# Patient Record
Sex: Female | Born: 1995 | Race: White | Hispanic: No | Marital: Single | Smoking: Never smoker
Health system: Southern US, Community
[De-identification: ages and names within clinical notes are randomized; demographics above are authoritative.]

## PROBLEM LIST (undated history)

## (undated) HISTORY — PX: ANTERIOR CRUCIATE LIGAMENT REPAIR: SHX115

---
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
## 2014-06-21 ENCOUNTER — Ambulatory Visit
Admission: RE | Admit: 2014-06-21 | Discharge: 2014-06-21 | Disposition: A | Discharge status: Home / Self Care | Payer: BLUE CROSS/BLUE SHIELD | Attending: Family Medicine | Admitting: Family Medicine

## 2014-06-21 ENCOUNTER — Ambulatory Visit
Admission: RE | Admit: 2014-06-21 | Discharge: 2014-06-21 | Disposition: A | Discharge status: Home / Self Care | Payer: BLUE CROSS/BLUE SHIELD | Source: Ambulatory Visit | Attending: Family Medicine | Admitting: Family Medicine

## 2014-06-21 ENCOUNTER — Other Ambulatory Visit: Payer: Self-pay | Admitting: Family Medicine

## 2014-06-21 DIAGNOSIS — M25561 Pain in right knee: Secondary | ICD-10-CM

## 2014-12-08 ENCOUNTER — Ambulatory Visit
Admission: RE | Admit: 2014-12-08 | Discharge: 2014-12-08 | Disposition: A | Discharge status: Home / Self Care | Payer: BLUE CROSS/BLUE SHIELD | Source: Ambulatory Visit | Attending: Family Medicine | Admitting: Family Medicine

## 2014-12-08 ENCOUNTER — Other Ambulatory Visit: Payer: Self-pay | Admitting: Family Medicine

## 2014-12-08 DIAGNOSIS — R609 Edema, unspecified: Secondary | ICD-10-CM

## 2014-12-08 DIAGNOSIS — M25562 Pain in left knee: Secondary | ICD-10-CM | POA: Insufficient documentation

## 2014-12-08 DIAGNOSIS — R52 Pain, unspecified: Secondary | ICD-10-CM

## 2014-12-09 ENCOUNTER — Ambulatory Visit
Admission: RE | Admit: 2014-12-09 | Discharge: 2014-12-09 | Disposition: A | Discharge status: Home / Self Care | Payer: BLUE CROSS/BLUE SHIELD | Source: Ambulatory Visit | Attending: Family Medicine | Admitting: Family Medicine

## 2014-12-09 ENCOUNTER — Other Ambulatory Visit: Payer: Self-pay | Admitting: Family Medicine

## 2014-12-09 DIAGNOSIS — Y9379 Activity, other specified sports and athletics: Secondary | ICD-10-CM | POA: Insufficient documentation

## 2014-12-09 DIAGNOSIS — M25562 Pain in left knee: Secondary | ICD-10-CM

## 2014-12-09 DIAGNOSIS — M25462 Effusion, left knee: Secondary | ICD-10-CM | POA: Diagnosis present

## 2014-12-09 DIAGNOSIS — S83512A Sprain of anterior cruciate ligament of left knee, initial encounter: Secondary | ICD-10-CM | POA: Insufficient documentation

## 2014-12-09 DIAGNOSIS — S8992XA Unspecified injury of left lower leg, initial encounter: Secondary | ICD-10-CM | POA: Diagnosis present

## 2014-12-09 DIAGNOSIS — S83222A Peripheral tear of medial meniscus, current injury, left knee, initial encounter: Secondary | ICD-10-CM | POA: Diagnosis not present

## 2015-04-28 ENCOUNTER — Encounter: Payer: Self-pay | Admitting: Emergency Medicine

## 2015-04-28 ENCOUNTER — Emergency Department
Admission: EM | Admit: 2015-04-28 | Discharge: 2015-04-28 | Disposition: A | Discharge status: Home / Self Care | Payer: BLUE CROSS/BLUE SHIELD | Attending: Emergency Medicine | Admitting: Emergency Medicine

## 2015-04-28 ENCOUNTER — Emergency Department: Payer: BLUE CROSS/BLUE SHIELD

## 2015-04-28 DIAGNOSIS — Z9889 Other specified postprocedural states: Secondary | ICD-10-CM | POA: Diagnosis not present

## 2015-04-28 DIAGNOSIS — G8918 Other acute postprocedural pain: Secondary | ICD-10-CM

## 2015-04-28 DIAGNOSIS — R2242 Localized swelling, mass and lump, left lower limb: Secondary | ICD-10-CM | POA: Diagnosis present

## 2015-04-28 NOTE — ED Provider Notes (Signed)
[facility] Emergency Department Provider Note   ____________________________________________  Time seen: Approximately 7:20 PM I have reviewed the triage vital signs and the triage nursing note.  HISTORY  Chief Complaint Leg Swelling   Historian Patient  HPI Gabriella Morgan is a 20 y.o. female with a history of anterior cruciate ligament repair 2 weeks ago on the left, she was in physical therapy today when she was noted to have some significant tenderness in the left calf. There is postoperative swelling which she thinks is actually going down. Pain is worse with movement or palpation. No fever. No skin rash or redness.Pain is moderate.    History reviewed. No pertinent past medical history.  There are no active problems to display for this patient.   Past Surgical History  Procedure Laterality Date  . Anterior cruciate ligament repair Bilateral     No current outpatient prescriptions on file.  Allergies Review of patient's allergies indicates no known allergies.  No family history on file.  Social History Social History  Substance Use Topics  . Smoking status: Never Smoker   . Smokeless tobacco: None  . Alcohol Use: Yes    Review of Systems  Constitutional: Negative for fever. Eyes:  ENT:  Cardiovascular: Negative for chest pain. Respiratory: Negative for shortness of breath. Gastrointestinal:  Genitourinary: Musculoskeletal:  Skin: Negative for rash. Neurological: 10 point Review of Systems otherwise negative ____________________________________________   PHYSICAL EXAM:  VITAL SIGNS: ED Triage Vitals  Enc Vitals Group     BP 04/28/15 1646 130/86 mmHg     Pulse Rate 04/28/15 1646 61     Resp 04/28/15 1646 18     Temp 04/28/15 1646 98.1 F (36.7 C)     Temp Source 04/28/15 1646 Oral     SpO2 04/28/15 1646 97 %     Weight 04/28/15 1646 135 lb (61.236 kg)     Height 04/28/15 1646  (1.6 m)     Head Cir --      Peak  Flow --      Pain Score 04/28/15 1647 2     Pain Loc --      Pain Edu? --      Excl. in GC? --      Constitutional: Alert and oriented. Well appearing and in no distress. HEENT   Head: Normocephalic and atraumatic.      Eyes: Conjunctivae are normal. PERRL. Normal extraocular movements.      Ears:         Nose: No congestion/rhinnorhea.   Mouth/Throat: Mucous membranes are moist.   Neck: No stridor. Cardiovascular/Chest: Normal rate, regular rhythm.  No murmurs, rubs, or gallops. Respiratory: Normal respiratory effort without tachypnea nor retractions. Breath sounds are clear and equal bilaterally. No wheezes/rales/rhonchi. Gastrointestinal:  Genitourinary/rectal:Deferred Musculoskeletal: Left knee Steri-Strips in place. Moderate swelling. Some tenderness to palpation and range of motion, mild. Mild Tenderness. Neurologic:  Normal speech and language. No gross or focal neurologic deficits are appreciated. Skin:  Skin is warm, dry and intact. No rash noted. Psychiatric: Mood and affect are normal. Speech and behavior are normal. Patient exhibits appropriate insight and judgment.  ____________________________________________   EKG I, Governor Rooks, MD, the attending physician have personally viewed and interpreted all ECGs.  None ____________________________________________  LABS (pertinent positives/negatives)  None  ____________________________________________  RADIOLOGY All Xrays were viewed by me. Imaging interpreted by Radiologist.  Ultrasound lower extremity left: No evidence of left lower extremity DVT __________________________________________  PROCEDURES  Procedure(s) performed:  None  Critical Care performed: None  ____________________________________________   ED COURSE / ASSESSMENT AND PLAN  Pertinent labs & imaging results that were available during my care of the patient were reviewed by me and considered in my medical decision making (see  chart for details).    No DVT. On exam no evidence of cellulitis. I suspect her swelling and pain is still postoperative especially with physical therapy.    CONSULTATIONS:   None   Patient / Family / Caregiver informed of clinical course, medical decision-making process, and agree with plan.   I discussed return precautions, follow-up instructions, and discharged instructions with patient and/or family.   ___________________________________________   FINAL CLINICAL IMPRESSION(S) / ED DIAGNOSES   Final diagnoses:  Post-operative pain              Note: This dictation was prepared with Dragon dictation. Any transcriptional errors that result from this process are unintentional   Governor Rooksebecca Demaryius Imran, MD 04/28/15 1944

## 2015-04-28 NOTE — ED Notes (Signed)
Patient states that she had surgery on her ACL 2 weeks ago. Patient noticed today that she was having some pain in her left calf and was concerned about a DVT.

## 2015-04-28 NOTE — ED Notes (Signed)
Patient to ER for c/o left leg swelling. Patient had ACL surgery 16 days ago. States swelling went down after surgery, but has now become more swollen again. +Pain to left calf.

## 2015-04-28 NOTE — Discharge Instructions (Signed)
You were evaluated for left calf pain and swelling, and no blood clot (no DVT) was found. I suspect your pain and swelling her postoperative in nature.  Return to the emergency department for any new or worsening condition including fever, skin rash, new or worsening pain, numbness, or weakness.

## 2015-12-26 IMAGING — CR DG KNEE COMPLETE 4+V*L*
1 series · 4 of 4 positions shown · non-contrast
Comparison: None.

CLINICAL DATA: Posteromedial knee pain. Lacrosse injury yesterday.
Initial encounter.

EXAM:
LEFT KNEE - COMPLETE 4+ VIEW

[Series 1: dg knee complete 4 views left · 0.14mm/px · 4 of 4 slices shown]
[im 1/4]
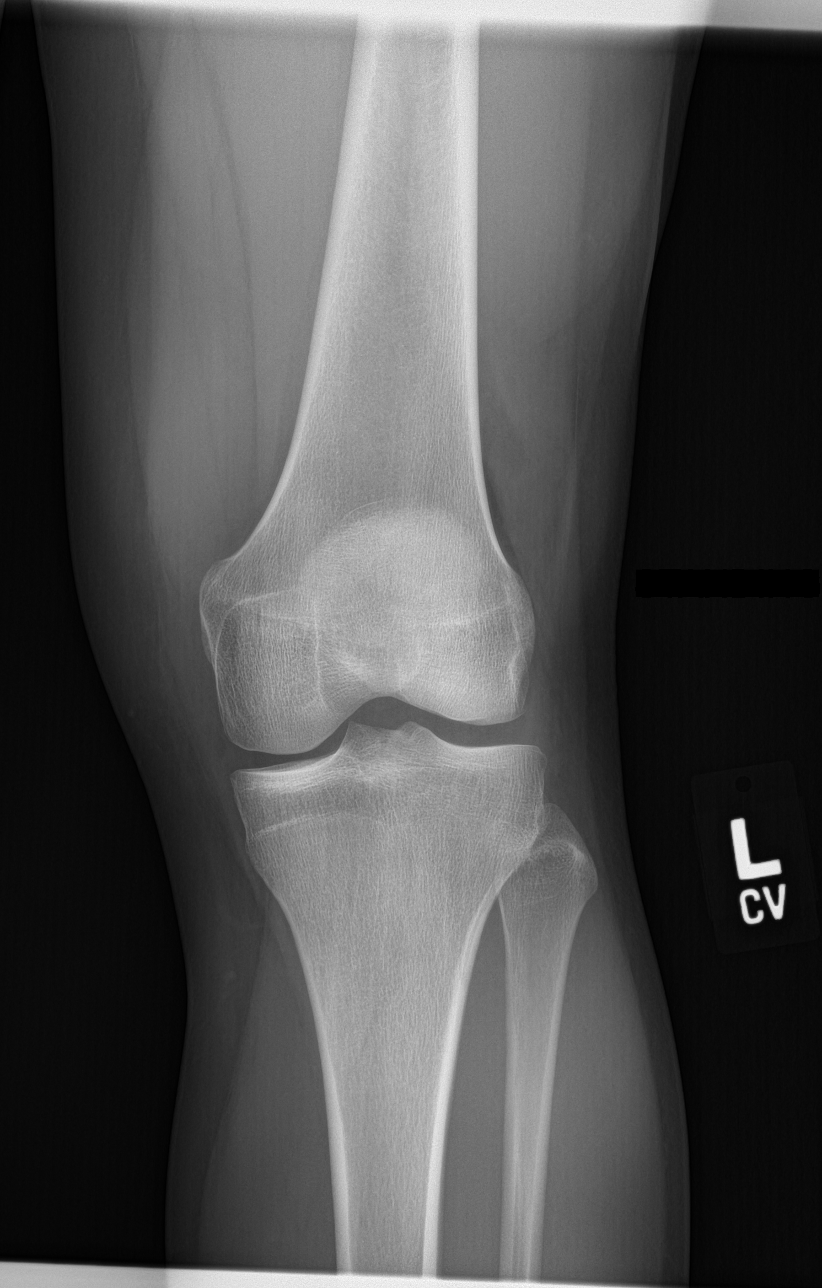
[im 2/4]
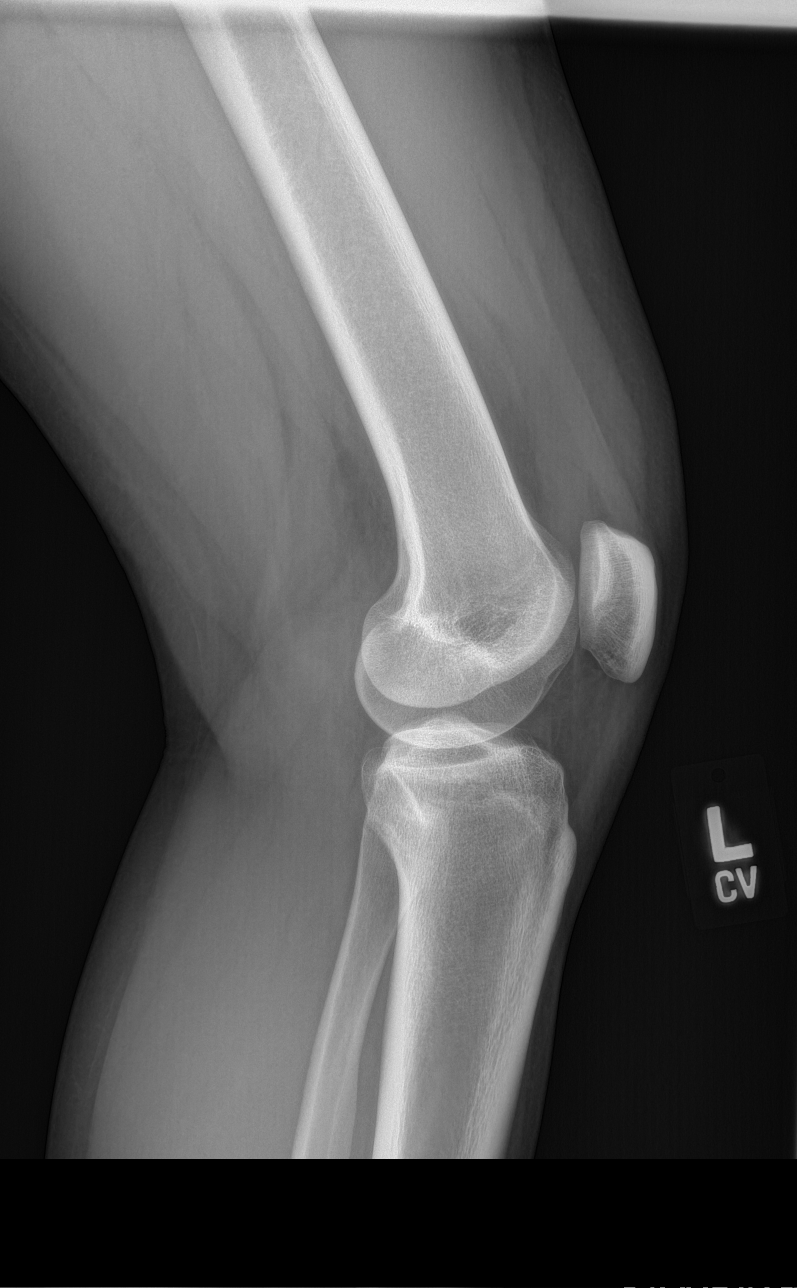
[im 3/4]
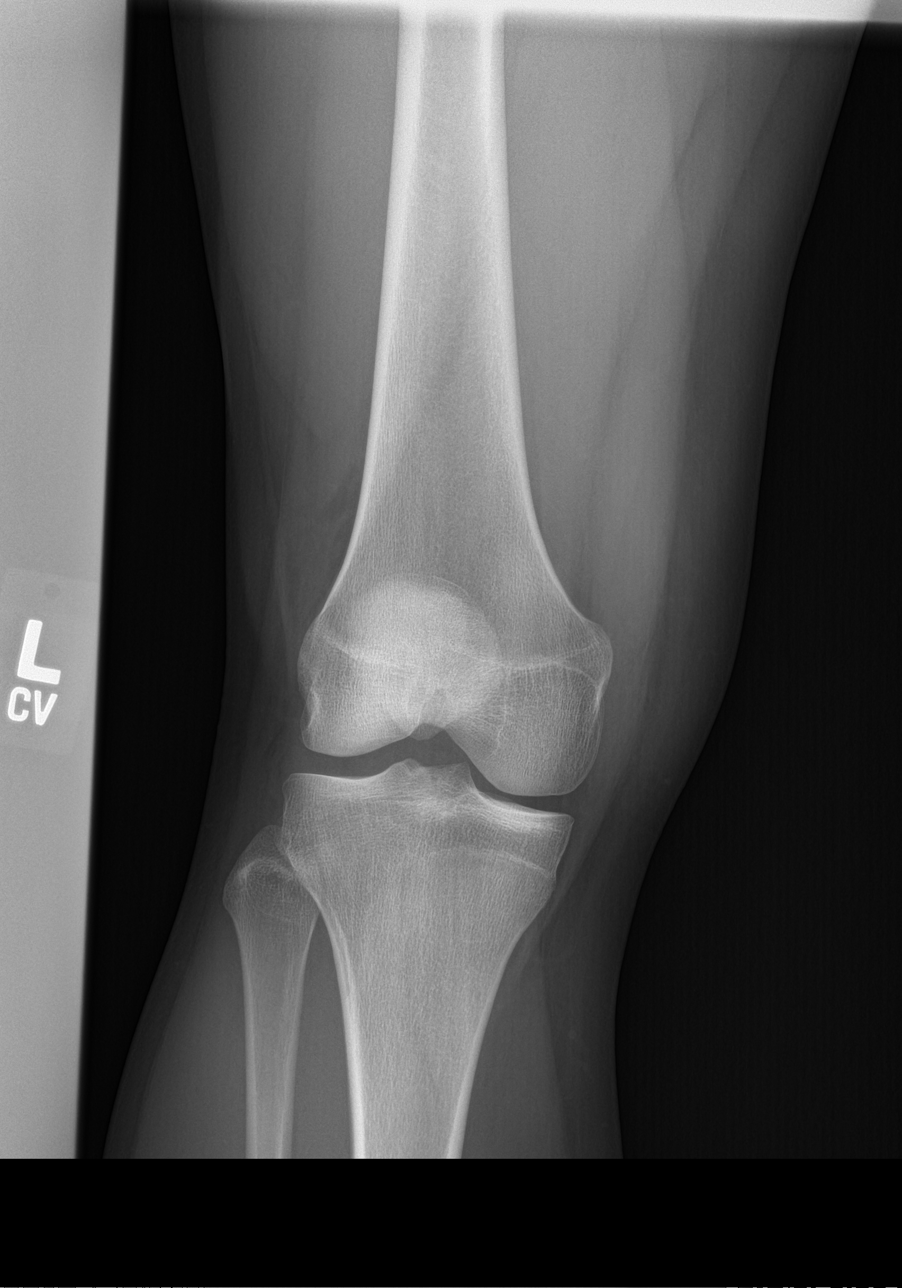
[im 4/4]
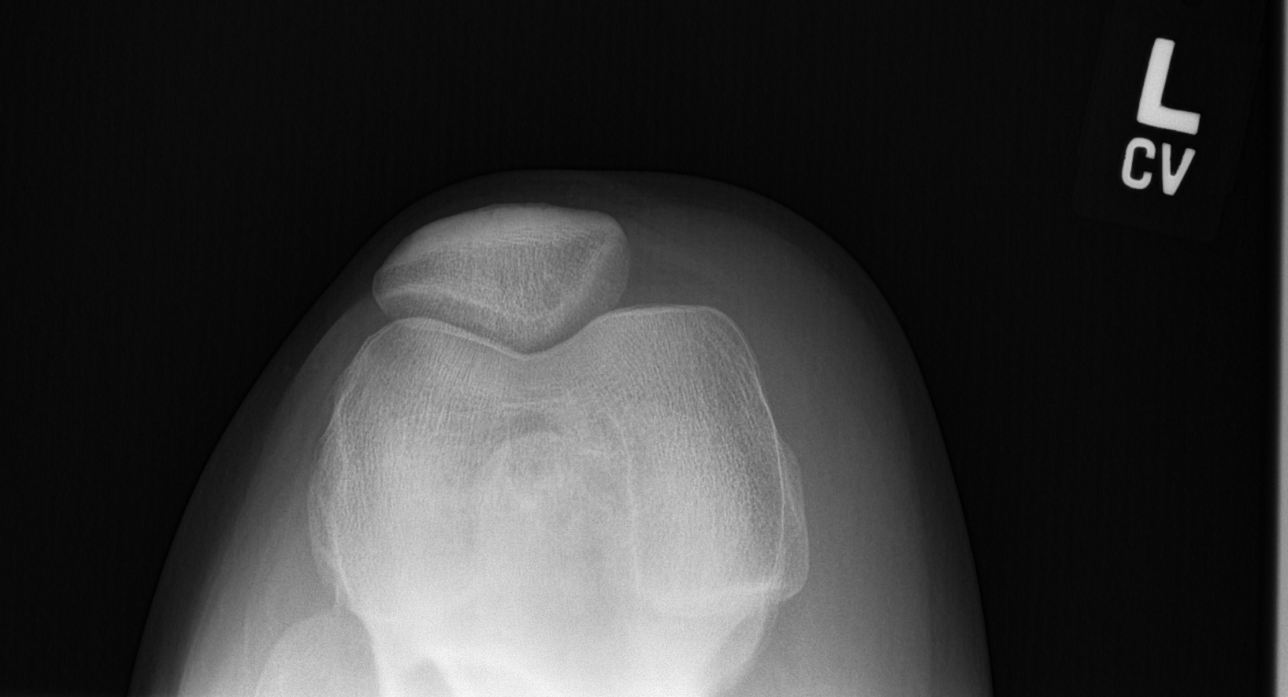

[4 of 4 positions shown; findings below may reference images not displayed]

FINDINGS: The mineralization and alignment are normal. There is no evidence of
acute fracture or dislocation. The joint spaces are maintained.
There is a possible small joint effusion.
IMPRESSION: No acute osseous findings.  Possible small joint effusion.

## 2017-05-21 IMAGING — US US EXTREM LOW VENOUS*L*
1 series · 13 of 24 positions shown · non-contrast
Comparison: None.

CLINICAL DATA: Left calf pain and swelling. Knee surgery 2 weeks
prior.



[Series 1: us extrem low venous*left* · 0.06mm/px · 13 of 47 slices shown]
[im 1/47]
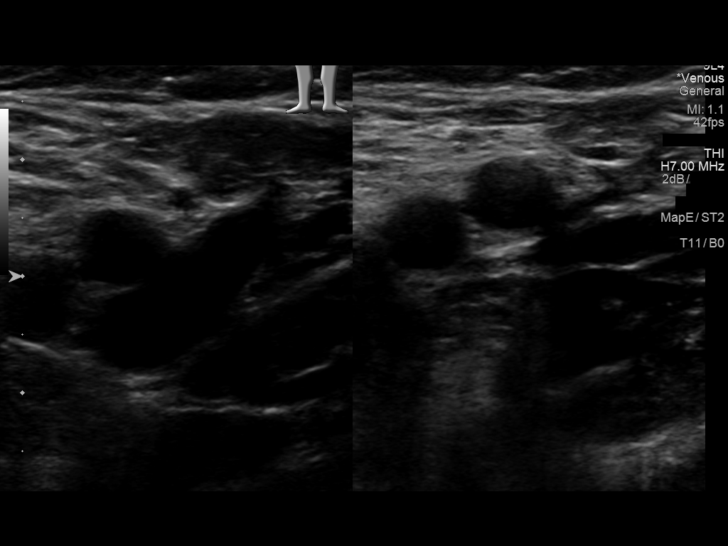
[im 5/47]
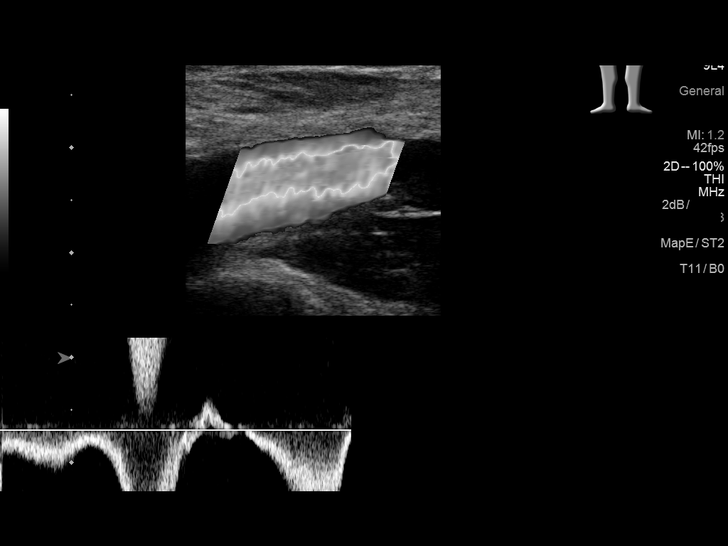
[im 9/47]
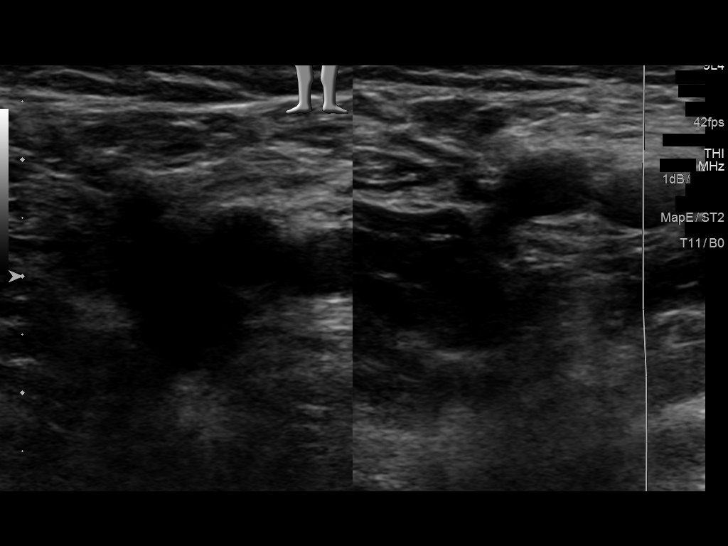
[im 13/47]
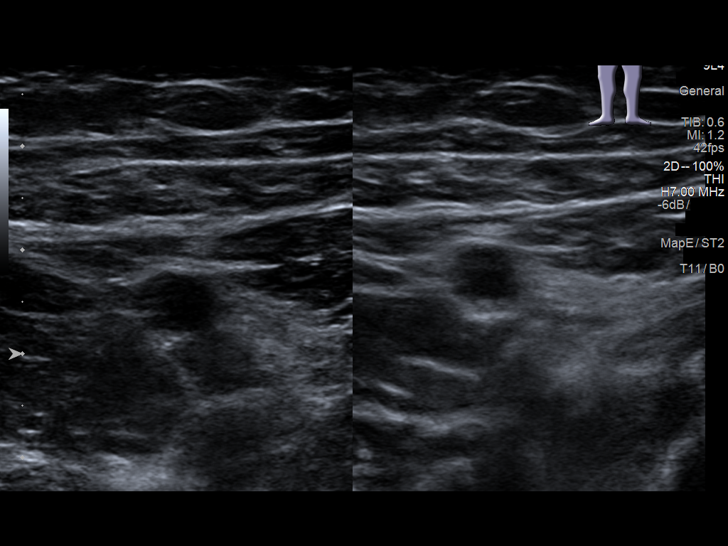
[im 17/47]
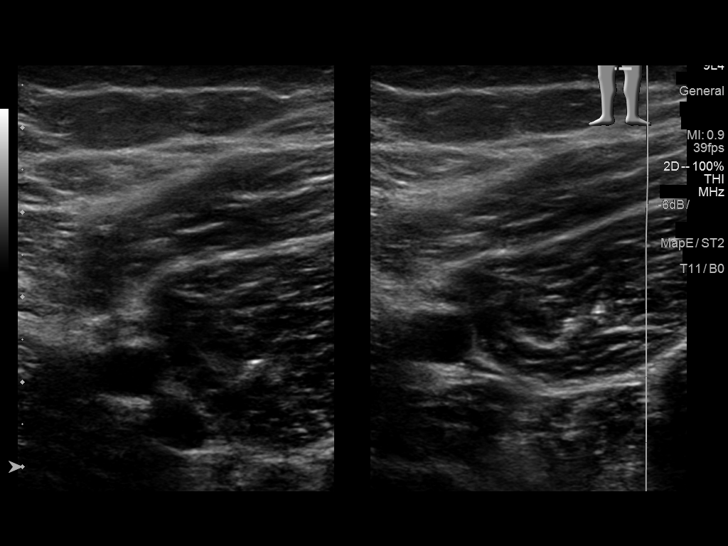
[im 21/47]
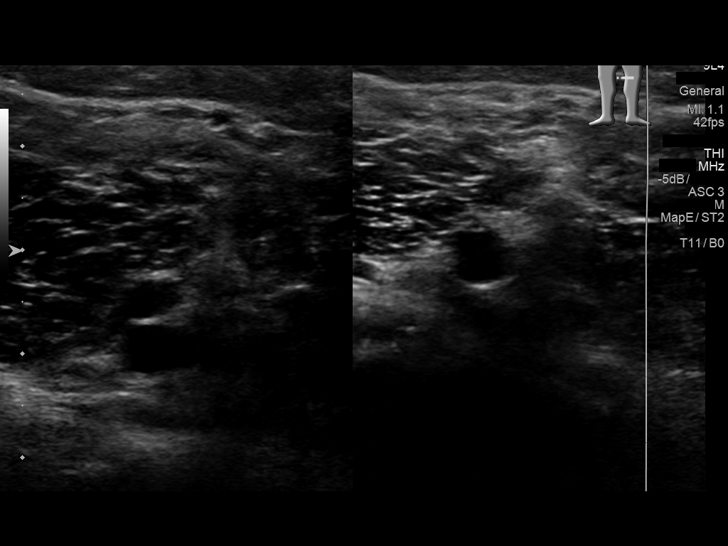
[im 25/47]
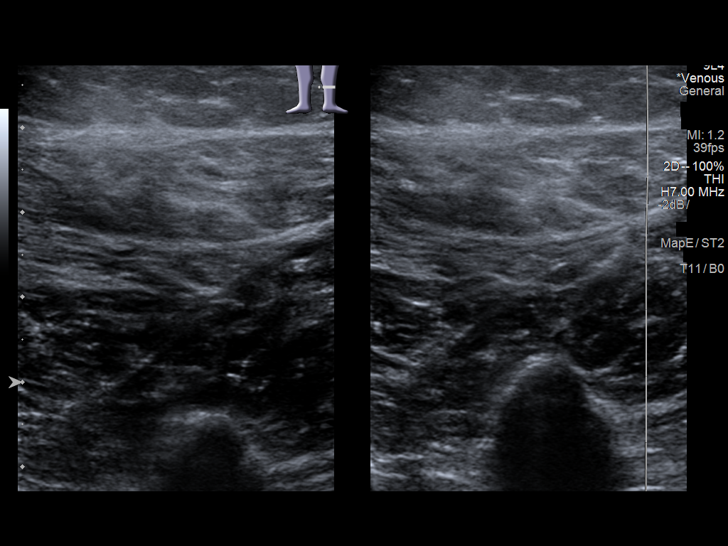
[im 27/47]
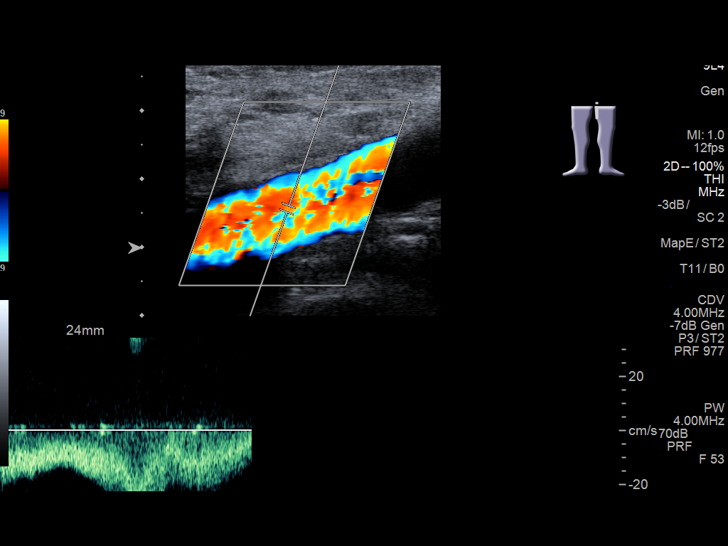
[im 31/47]
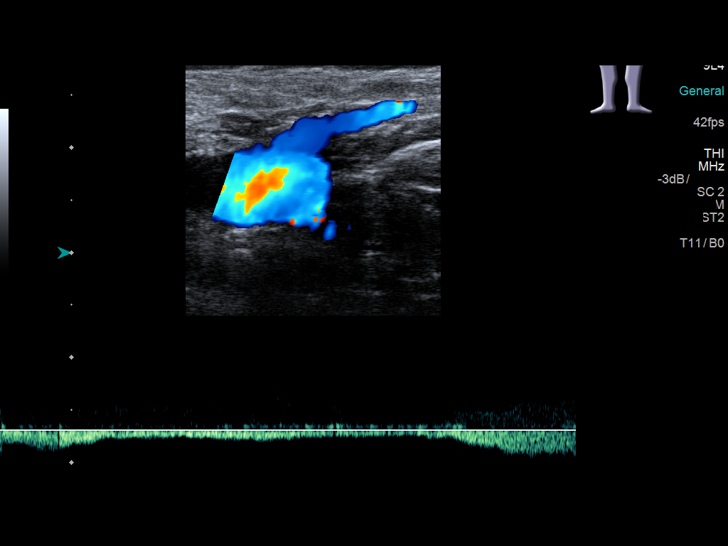
[im 35/47]
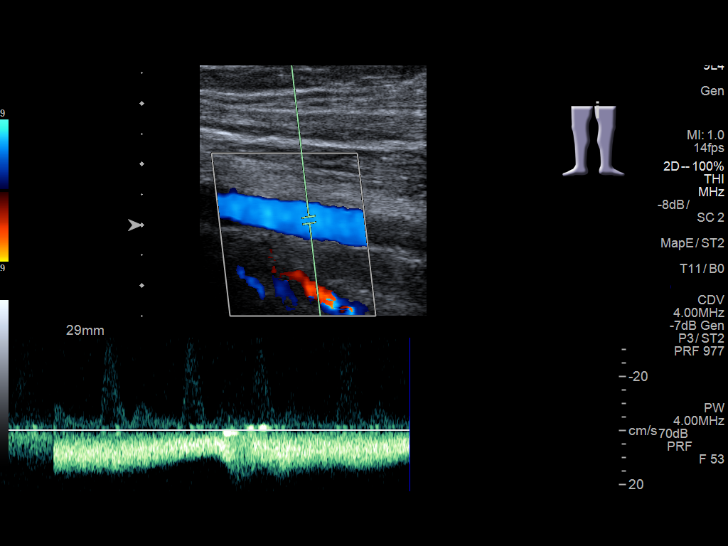
[im 39/47]
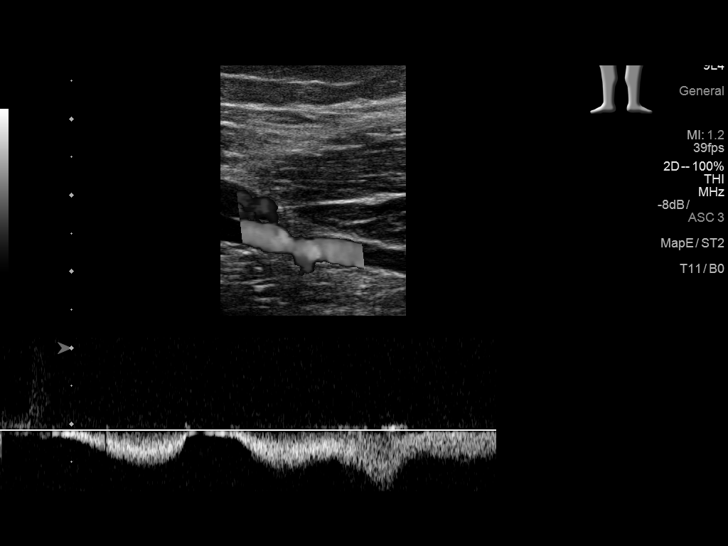
[im 43/47]
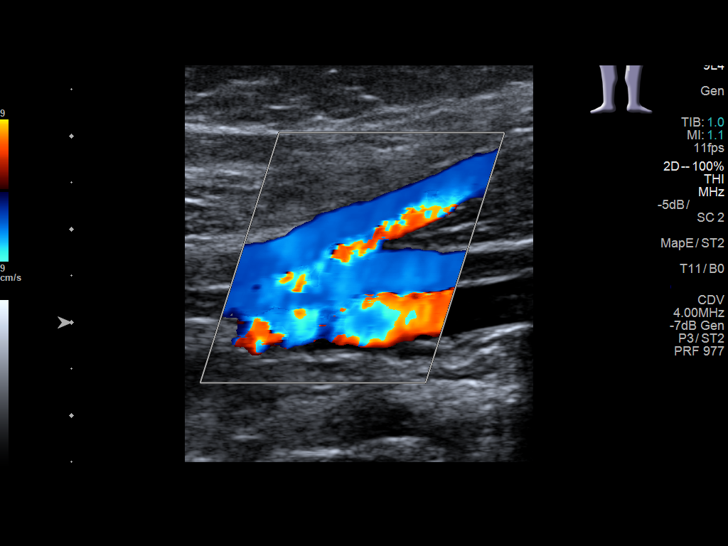
[im 47/47]
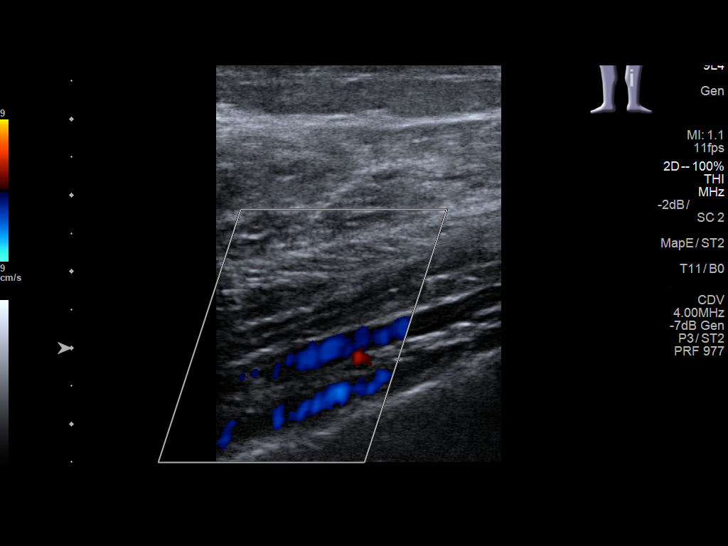

[13 of 24 positions shown; findings below may reference images not displayed]

FINDINGS: Contralateral Common Femoral Vein: Respiratory phasicity is normal
and symmetric with the symptomatic side. No evidence of thrombus.
Normal compressibility.

Common Femoral Vein: No evidence of thrombus. Normal
compressibility, respiratory phasicity and response to augmentation.

Saphenofemoral Junction: No evidence of thrombus. Normal
compressibility and flow on color Doppler imaging.

Profunda Femoral Vein: No evidence of thrombus. Normal
compressibility and flow on color Doppler imaging.

Femoral Vein: No evidence of thrombus. Normal compressibility,
respiratory phasicity and response to augmentation.

Popliteal Vein: No evidence of thrombus. Normal compressibility,
respiratory phasicity and response to augmentation.

Calf Veins: No evidence of thrombus. Normal compressibility and flow
on color Doppler imaging.

Superficial Great Saphenous Vein: No evidence of thrombus. Normal
compressibility and flow on color Doppler imaging.

Venous Reflux:  None.

Other Findings:  None.
IMPRESSION: No evidence of left lower extremity deep venous thrombosis.

## 2018-04-14 DIAGNOSIS — L700 Acne vulgaris: Secondary | ICD-10-CM

## 2018-04-14 MED ORDER — SPIRONOLACTONE 50 MG TABLET
50 | ORAL_TABLET | Freq: Every day | ORAL | 2 refills | Status: AC
Start: 2018-04-14 — End: ?

## 2018-04-14 MED ORDER — NORETHINDRONE-ETH. ESTRADIOL-IRON 1-20 (5)/1-30(7)/1MG-35MCG(9) TABLET
ORAL | Status: AC
Start: 2018-04-14 — End: ?

## 2018-04-14 NOTE — Progress Notes (Signed)
Subjective   Chief Complaint   Patient presents with    Acne     NP - Face     History of Present Illness:  Shelley Peck is a 23 y.o. female with HPI as follows:  Here for evaluation of acne on face x 3 months. She had acne as a teenager, but this is the first flare she's experienced since. No cyclic pattern. No chest/back involvement. Not currently using prescription tx for her acne. No exacerbating factors.    Personal Skin Cancer History   Melanoma No   Nonmelanoma skin cancer No       Family Skin Cancer Hx     Problem Relation (Age of Onset)    Basal cell carcinoma Neg Hx    Melanoma Neg Hx    Squamous cell carcinoma Neg Hx        Patient's allergies, medications, past medical, surgical, family and social histories were reviewed and updated as appropriate. and Shelley Peck  has no past medical history of Melanoma (HCC) or Nonmelanoma skin cancer.    Review of Systems   Constitutional: Negative.    Musculoskeletal: Negative.    Skin/Breast: Negative for changing mole.          Objective   Physical Examination:  Skin elements examined without findings:    Neck    Chest & Axillae    Abdomen    Back    R Upper Extremity    L Upper Extremity    R Lower Extremity    L Lower Extremity    Genitalia, Groin, & Buttocks    Hair    Eyes (Conjunctiva and Lids)  Skin elements examined with findings:    Head.  Skin exam findings:    Face - sparse acneiform papules    Additional Exam Findings    General Appearance negative: Well-appearing, well-nourished, with no obvious deformities.     Orientation negative: Oriented to time, place and person.     Mood & Affect negative: No evidence of depression, mania, anxiety, or agitation.       Data Reviewed:  Patient's relevant lab, radiology, micro, and pathology results were reviewed as appropriate.       Assessment   Assessment and Plan (numbered by problem):  1. Uncontrolled acne Vulgaris - face  -reviewed in detail and all questions answered.  -discussed treatment options including  spironolactone and Accutane. RBSE.  -distributed Accutane handbook  -start Spironolactone 50 mg tab QD    RTC 7 weeks    Counseling:  Assessment and Plan were discussed.  Potential medication risks, benefits, side-effects, and interactions discussed   Counseled patient to discontinue Rx if pregnant  Details of diagnosis and prognosis discussed      I, Aidan Loeser am acting as a Neurosurgeon for services provided by Syble Creek, MD on 04/14/2018 8:44 AM      The above scribed documentation as annotated by me accurately reflects the services I have provided.   Syble Creek, MD  04/14/2018 8:57 AM

## 2018-04-15 ENCOUNTER — Ambulatory Visit: Admit: 2018-04-15 | Discharge: 2018-04-15 | Payer: BLUE CROSS/BLUE SHIELD

## 2018-05-14 NOTE — Telephone Encounter (Signed)
Pt is scheduled for 04/02, but wanted to let you knoe she believes the spironolactone (ALDACTONE) 50 mg tablet was giving her severe headaches  She stopped the Rx 5 days please advise on if this is a common side effect

## 2018-05-21 DIAGNOSIS — R51 Headache: Secondary | ICD-10-CM

## 2018-05-21 DIAGNOSIS — R519 Headache, unspecified: Secondary | ICD-10-CM

## 2018-05-21 DIAGNOSIS — L700 Acne vulgaris: Secondary | ICD-10-CM

## 2018-05-21 NOTE — Progress Notes (Signed)
Subjective   Chief Complaint   Patient presents with    Acne     F/U-face-Medication     History of Present Illness:  Shelley Peck is a 23 y.o. female with HPI as follows:  Seen 5 weeks ago when she started spironolactone but experienced 'migraine type' headaches within 2-3 days and stopped. Reluctant to restart. Currently with family on [place] waiting out pandemic.    Personal Skin Cancer History   Melanoma No   Nonmelanoma skin cancer No       Family Skin Cancer Hx     Problem Relation (Age of Onset)    Basal cell carcinoma Neg Hx    Melanoma Neg Hx    Squamous cell carcinoma Neg Hx        Patient's allergies, medications, past medical, surgical, family and social histories were reviewed and updated as appropriate.    Review of Systems    No constitutional symptoms, musculoskeletal symptoms.     Objective   Physical Examination:  Skin elements examined without findings:    Neck    Chest & Axillae    R Upper Extremity    L Upper Extremity  Skin elements examined with findings:    Head.  Skin exam findings:    Acneiform papules deep set BL cheeks      Data Reviewed:  Patient's relevant lab, radiology, micro, and pathology results were reviewed as appropriate.       Assessment   Assessment and Plan (numbered by problem):  1. Headache 2/2 spironolactone  - do not restart  - reviewed well established low freq SE of medication  2. Acne vulgaris moderate - severe  - isotretinoin tbar reviewed in detail and all questions answered.  - preg test, sign Ipledge, write MD  - f/u after 2nd preg test    Minor Procedure Notes (if applicable):     Counseling:  Assessment and Plan were discussed.    I performed this consultation using real-time Telehealth tools, including a live video connection between my location and the patient's location. Prior to initiating the consultation, I obtained informed verbal consent to perform this consultation using Telehealth tools and answered all the questions about the Telehealth  interaction.

## 2018-05-22 ENCOUNTER — Ambulatory Visit: Admit: 2018-05-22 | Discharge: 2018-05-22 | Payer: BLUE CROSS/BLUE SHIELD

## 2018-05-28 NOTE — Result Quicknote (Signed)
iud ocp

## 2018-05-29 NOTE — Telephone Encounter (Signed)
Pt called asking for lab orders to be faxed in [place], [place]  Printed and faxed to [phone]

## 2018-05-31 LAB — HCG PREGNANCY, URINE, >= 18 YE: HCG Pregnancy, Urine, >= 18 ye: NEGATIVE

## 2018-07-01 ENCOUNTER — Ambulatory Visit: Admit: 2018-07-01 | Discharge: 2018-07-01 | Payer: BLUE CROSS/BLUE SHIELD

## 2018-07-01 DIAGNOSIS — L700 Acne vulgaris: Secondary | ICD-10-CM

## 2018-07-01 MED ORDER — ISOTRETINOIN 40 MG CAPSULE
40 | ORAL_CAPSULE | Freq: Every day | ORAL | 0 refills | Status: DC
Start: 2018-07-01 — End: 2018-07-29

## 2018-07-01 NOTE — Progress Notes (Signed)
Subjective   Chief Complaint   Patient presents with    Acne     F/U - face     History of Present Illness:  Shelley Peck is a 23 y.o. female with HPI as follows:  Confirmation visit/2nd accutane visit for start. Today reports has reread tbar of isotret and ready to start. Mild chinline flare in last 2 days.    Personal Skin Cancer History   Melanoma No   Nonmelanoma skin cancer No       Family Skin Cancer Hx     Problem Relation (Age of Onset)    Basal cell carcinoma Neg Hx    Melanoma Neg Hx    Squamous cell carcinoma Neg Hx        Patient's allergies, medications, past medical, surgical, family and social histories were reviewed and updated as appropriate.    Review of Systems    No constitutional symptoms, musculoskeletal symptoms.     Objective   Physical Examination:  Skin elements examined without findings:    Neck    Chest & Axillae    Abdomen    R Upper Extremity    L Upper Extremity  Skin elements examined with findings:    Head.  Skin exam findings:    chinline acneiform papules, deepset      Data Reviewed:  Micro, past medical history reviewed in detail and all questions answered.        Assessment   Assessment and Plan (numbered by problem):  Acne vulgaris, severe, uncontrolled.  - pregnancy test today  - accutane 58'  - tbar including teratogenecity risk reviewed in detail and all questions answered  - iud/ocp  - f/u video visit 1 mo      Minor Procedure Notes (if applicable):     Counseling:  Assessment and Plan were discussed.  Differential Diagnosis and w/u plan discussed      I performed this consultation using real-time Telehealth tools, including a live video connection between my location and the patient's location. Prior to initiating the consultation, I obtained informed verbal consent to perform this consultation using Telehealth tools and answered all the questions about the Telehealth interaction.

## 2018-07-01 NOTE — Telephone Encounter (Signed)
Spoke with pt, scheduled for today for VV

## 2018-07-01 NOTE — Telephone Encounter (Signed)
-----   Message from Syble Creek, MD sent at 06/29/2018  1:22 PM PDT -----  Regarding: video visit this week  Book at end of clinic Wed pls

## 2018-07-04 LAB — HCG PREGNANCY, URINE, >= 18 YE: HCG Pregnancy, Urine, >= 18 ye: NEGATIVE

## 2018-07-29 ENCOUNTER — Ambulatory Visit: Admit: 2018-07-29 | Discharge: 2018-07-29 | Payer: BLUE CROSS/BLUE SHIELD

## 2018-07-29 DIAGNOSIS — Z5181 Encounter for therapeutic drug level monitoring: Secondary | ICD-10-CM

## 2018-07-29 DIAGNOSIS — L700 Acne vulgaris: Secondary | ICD-10-CM

## 2018-07-29 LAB — HCG PREGNANCY, URINE, >= 18 YE: HCG Pregnancy, Urine, >= 18 ye: NEGATIVE

## 2018-07-29 MED ORDER — ISOTRETINOIN 40 MG CAPSULE
40 | ORAL_CAPSULE | Freq: Every day | ORAL | 0 refills | Status: DC
Start: 2018-07-29 — End: 2019-01-11

## 2018-07-29 NOTE — Progress Notes (Signed)
Subjective   Chief Complaint   Patient presents with    Acne     F/U - Face     History of Present Illness:  Shelley Peck is a 23 y.o. female with HPI as follows:  Should be at end of first month accutane at 40. Mild chinline flare at last visit.    Today reports less acne already noticed on cheeks, very dry with cracked lips and mildly irritated eyes in the last 2 weeks. negative a    Personal Skin Cancer History   Melanoma No   Nonmelanoma skin cancer No       Family Skin Cancer Hx     Problem Relation (Age of Onset)    Basal cell carcinoma Neg Hx    Melanoma Neg Hx    Squamous cell carcinoma Neg Hx        Patient's allergies, medications, past medical, surgical, family and social histories were reviewed and updated as appropriate.    Review of Systems    No constitutional symptoms, musculoskeletal symptoms.     Objective   Physical Examination:  Skin elements examined without findings:    Neck    Chest & Axillae    R Upper Extremity    L Upper Extremity  Skin elements examined with findings:    Head.  Skin exam findings:    Sparse acneiform papules and xerosis BL cheeks    Additional Exam Findings    General Appearance negative: Well-appearing, well-nourished, with no obvious deformities.     Mood & Affect negative: No evidence of depression, mania, anxiety, or agitation.       Data Reviewed:  Patient's relevant lab, radiology, micro, and pathology results were reviewed as appropriate.       Assessment   Assessment and Plan (numbered by problem):  Acne vulgaris, severe, uncontrolled.  - pregnancy test today  - accutane 7640'    Med Mon visit  - tbar including teratogenecity risk reviewed in detail and all questions answered  - iud/ocp  - f/u video visit 1 mo        Minor Procedure Notes (if applicable):     Counseling:  Assessment and Plan were discussed.  Differential Diagnosis and w/u plan discussed

## 2018-09-03 ENCOUNTER — Ambulatory Visit: Admit: 2018-09-03 | Discharge: 2018-09-03 | Payer: BLUE CROSS/BLUE SHIELD

## 2018-09-03 DIAGNOSIS — Z5181 Encounter for therapeutic drug level monitoring: Secondary | ICD-10-CM

## 2018-09-03 DIAGNOSIS — L700 Acne vulgaris: Secondary | ICD-10-CM

## 2018-09-03 MED ORDER — ISOTRETINOIN 40 MG CAPSULE
40 mg | ORAL_CAPSULE | Freq: Every day | ORAL | 0 refills | Status: DC
Start: 2018-09-03 — End: 2019-02-05

## 2018-09-03 NOTE — Progress Notes (Signed)
Subjective   Chief Complaint   Patient presents with    Acne     F/u - on accutane     History of Present Illness:  Shelley Peck is a 23 y.o. female with HPI as follows:  Last seen 07/29/18 for acne vulgaris. At that time, she started Accutane 40 mg.     Since last visit, her acne has started improving. Her skin is dry, but manageable. No joint pain or mood changes.     Personal Skin Cancer History   Melanoma No   Nonmelanoma skin cancer No       Family Skin Cancer Hx     Problem Relation (Age of Onset)    Basal cell carcinoma Neg Hx    Melanoma Neg Hx    Squamous cell carcinoma Neg Hx        Patient's allergies, medications, past medical, surgical, family and social histories were reviewed and updated as appropriate. and Shelley Peck  has no past medical history of Melanoma (CMS code) or Nonmelanoma skin cancer.    Review of Systems   Constitutional: Negative.    Musculoskeletal: Negative.    Skin/Breast: Negative for changing mole.          Objective   Physical Examination:  Skin elements examined without findings:    Neck    Chest & Axillae    R Upper Extremity    L Upper Extremity  Skin elements examined with findings:    Head.  Skin exam findings:    Face - acneiform papules R >> L    Additional Exam Findings    General Appearance negative: Well-appearing, well-nourished, with no obvious deformities.     Orientation negative: Oriented to time, place and person.     Mood & Affect negative: No evidence of depression, mania, anxiety, or agitation.       Data Reviewed:  Patient's relevant lab, radiology, micro, and pathology results were reviewed as appropriate.  Labs from last visit:   Appointment on 07/29/2018   Component Date Value Ref Range Status    HCG Pregnancy, Urine, >= 18 years 07/29/2018 NEG  NEG Final          Assessment   Assessment and Plan (numbered by problem):  1. Nodulocystic Acne: severe, refractory, necessitating systemic therapy with isotretinoin.  Improving on current therapy.   -Reviewed  isotretinoin in detail, including IPledge program.  Risks/benefits of medication reviewed with patient.  -Reviewed potential side effects of the medication including (but not limited to) xerosis, dry eyes and mouth, elevated liver enzymes, elevated triglyceride levels, alopecia, joint and muscle pain, changes in mood, headaches, etc.  The patient was advised to contact us immediately if new or concerning symptoms or signs arise.  -Will need to continue with therapy until goal cumulative dose of 120-150mg /kg is obtained  -Target cumulative dose: 120-150mg /kg (Started therapy 07/29/18)  -Recommend taking 40mg  per day  -The patient was advised not to share medication with other individuals  -The patient was advised not to donate blood products while on the medication  -The patient knows to inform our office should they start or stop any new medications    -iPLEDGE ID: [id number]  -The patient was confirmed in the iPledge system today and the medication refilled.    2. Medication monitoring encounter for isotretinoin  -Reviewed need for periodic monitoring of blood work (CBC, AST, ALT, TG).  -Today plan to obtain following labs: urine pregnancy, cholesterol  -Reviewed that isotretinoin is a teratogen and  that while on therapy there is a need to obtain monthly negative pregnancy tests.  Last pregnancy test performed on 07/29/18.   In addition the patient needs to be on 2 forms of birth control, which are documented to be:   1. IUD   2. OCP    RTC 1 month    Counseling:  Assessment and Plan were discussed.  Potential medication risks, benefits, side-effects, and interactions discussed   Counseled patient regarding pregnancy avoidance while on Rx  Details of diagnosis and prognosis discussed      I, Aidan Loeser am acting as a Education administrator for services provided by Heywood Iles, MD on 09/03/2018 3:56 PM      The above scribed documentation as annotated by me accurately reflects the services I have provided.   Heywood Iles, MD  09/03/2018 4:11 PM

## 2018-09-04 LAB — CHOLESTEROL, LDL (INCL. TOT. A
Chol HDL Ratio: 2.4 (ref <6.0)
Cholesterol, HDL: 53 mg/dL (ref >39)
Cholesterol, Total: 126 mg/dL (ref <200)
LDL Cholesterol: 63 mg/dL (ref <130)
Non HDL Cholesterol: 73 mg/dL (ref <160)
Triglycerides, serum: 52 mg/dL (ref <200)

## 2018-09-04 LAB — HCG PREGNANCY, URINE, >= 18 YE: HCG Pregnancy, Urine, >= 18 ye: NEGATIVE

## 2018-10-01 ENCOUNTER — Ambulatory Visit: Admit: 2018-10-01 | Discharge: 2018-10-01 | Payer: BLUE CROSS/BLUE SHIELD

## 2018-10-01 DIAGNOSIS — L700 Acne vulgaris: Secondary | ICD-10-CM

## 2018-10-01 DIAGNOSIS — Z5181 Encounter for therapeutic drug level monitoring: Secondary | ICD-10-CM

## 2018-10-01 MED ORDER — ISOTRETINOIN 40 MG CAPSULE
40 | ORAL_CAPSULE | Freq: Every day | ORAL | 0 refills | Status: DC
Start: 2018-10-01 — End: 2018-10-13

## 2018-10-01 NOTE — Progress Notes (Signed)
Subjective   Chief Complaint   Patient presents with    Acne     flaring cheeks     History of Present Illness:  Shelley Peck is a 23 y.o. female with HPI as follows:  Last seen 09/03/18 for acne on Accutane 40 (started 07/29/18). Dryness on lips and inside of nose, less dry on face. Few breakouts mostly on the chinline, but mostly surface irregularity.    Personal Skin Cancer History   Melanoma No   Nonmelanoma skin cancer No       Family Skin Cancer Hx     Problem Relation (Age of Onset)    Basal cell carcinoma Neg Hx    Melanoma Neg Hx    Squamous cell carcinoma Neg Hx        Patient's allergies, medications, past medical, surgical, family and social histories were reviewed and updated as appropriate. and Shelley Peck  has no past medical history of Melanoma (CMS code) or Nonmelanoma skin cancer.    Review of Systems   Constitutional: Negative.    Musculoskeletal: Negative.    Skin/Breast: Negative for changing mole.          Objective   Physical Examination:  Skin elements examined without findings:    Neck    Chest & Axillae    R Upper Extremity    L Upper Extremity  Skin elements examined with findings:    Head.  Skin exam findings:    Face - acneiform papules diffuse cheeks    Additional Exam Findings    General Appearance negative: Well-appearing, well-nourished, with no obvious deformities.     Orientation negative: Oriented to time, place and person.     Mood & Affect negative: No evidence of depression, mania, anxiety, or agitation.       Data Reviewed:  Patient's relevant lab, radiology, micro, and pathology results were reviewed as appropriate.  Labs from last visit:   Appointment on 09/03/2018   Component Date Value Ref Range Status    Cholesterol, Total 09/03/2018 126  <200 mg/dL Final    Comment: New assay introduced on April 14, 2018 at [facility] and [facility] 09[facility] 2018 [facility] with little or no impact on reference range and patient results.  Desirable      Triglycerides, serum  09/03/2018 52  <200 mg/dL Final    New assay introduced on April 14, 2018 at [facility] and [facility] with little or no impact on reference range and patient results.    Cholesterol, HDL 09/03/2018 53  >39 mg/dL Final    Comment: New assay introduced on April 14, 2018 at [facility] and [facility] with little or no impact on reference range and patient results.  Acceptable      LDL Cholesterol 09/03/2018 63  <130 mg/dL Final    Optimal    Chol HDL Ratio 09/03/2018 2.4  <4.5<6.0 Final    Non HDL Cholesterol 09/03/2018 73  <160 mg/dL Final    Optimal    HCG Pregnancy, Urine, >= 18 years 09/03/2018 NEG  NEG Final          Assessment   Assessment and Plan (numbered by problem):  1. Nodulocystic Acne: severe, refractory, necessitating systemic therapy with isotretinoin.  Improving on current therapy.   -Reviewed isotretinoin in detail, including IPledge program.  Risks/benefits of medication reviewed with patient.  -Reviewed potential side effects of  the medication including (but not limited to) xerosis, dry eyes and mouth, elevated liver enzymes, elevated triglyceride levels, alopecia, joint and muscle pain, changes in mood, headaches, etc.  The patient was advised to contact us immediately if new or concerning symptoms or signs arise.  -Will need to continue with therapy until goal cumulative dose of 120-150mg /kg is obtained  -Target cumulative dose: 120-150mg /kg (Started therapy 07/29/18)  -Recommend taking 40mg  per day  -The patient was advised not to share medication with other individuals  -The patient was advised not to donate blood products while on the medication  -The patient knows to inform our office should they start or stop any new medications    -iPLEDGE ID: [id number]  -The patient was confirmed in the iPledge system today and the medication refilled.    2. Medication monitoring encounter for isotretinoin  -Reviewed need for  periodic monitoring of blood work (CBC, AST, ALT, TG).  -Today plan to obtain following labs: urine pregnancy, cholesterol  -Reviewed that isotretinoin is a teratogen and that while on therapy there is a need to obtain monthly negative pregnancy tests.  Last pregnancy test performed on 07/29/18.   In addition the patient needs to be on 2 forms of birth control, which are documented to be:              1. IUD              2. OCP    RTC 1 month    Minor Procedure Notes (if applicable):     Counseling:  Assessment and Plan were discussed.  Differential Diagnosis and w/u plan discussed      I, Aidan Loeser am acting as a scribe for services provided by Syble Creekaymond Jaihyun Jacobus Colvin, MD on 10/01/2018 11:41 AM      The above scribed documentation as annotated by me accurately reflects the services I have provided.   Syble Creekaymond Jaihyun Makarios Madlock, MD  10/01/2018 11:42 AM      I performed this consultation using real-time Telehealth tools, including a live video connection between my location and the patient's location. Prior to initiating the consultation, I obtained informed verbal consent to perform this consultation using Telehealth tools and answered all the questions about the Telehealth interaction.

## 2018-10-09 DIAGNOSIS — L700 Acne vulgaris: Secondary | ICD-10-CM

## 2018-10-10 ENCOUNTER — Ambulatory Visit: Admit: 2018-10-10 | Discharge: 2018-10-10 | Payer: BLUE CROSS/BLUE SHIELD

## 2018-10-10 LAB — HCG PREGNANCY, URINE, >= 18 YE: HCG Pregnancy, Urine, >= 18 ye: NEGATIVE

## 2018-10-13 MED ORDER — ISOTRETINOIN 40 MG CAPSULE
40 | ORAL_CAPSULE | Freq: Every day | ORAL | 0 refills | Status: AC
Start: 2018-10-13 — End: 2019-02-05

## 2018-10-13 NOTE — Telephone Encounter (Signed)
Pt called and is requesting that [facility] [address] [facility]. Please advise, thanks

## 2018-10-13 NOTE — Telephone Encounter (Signed)
Requested Prescriptions     Pending Prescriptions Disp Refills    ISOtretinoin (ACCUTANE) 40 mg capsule 30 capsule 0     Sig: Take 1 capsule (40 mg total) by mouth daily

## 2018-10-14 NOTE — Telephone Encounter (Signed)
Pt called again to check in on the status of her accutane. Please advise, thank you.

## 2018-11-06 ENCOUNTER — Ambulatory Visit: Admit: 2018-11-06 | Discharge: 2018-11-06 | Payer: BLUE CROSS/BLUE SHIELD

## 2018-11-06 DIAGNOSIS — Z5181 Encounter for therapeutic drug level monitoring: Secondary | ICD-10-CM

## 2018-11-06 DIAGNOSIS — L700 Acne vulgaris: Secondary | ICD-10-CM

## 2018-11-06 MED ORDER — ISOTRETINOIN 40 MG CAPSULE
40 | ORAL_CAPSULE | Freq: Every day | ORAL | 0 refills | Status: AC
Start: 2018-11-06 — End: 2019-02-05

## 2018-11-06 NOTE — Progress Notes (Signed)
Subjective   Chief Complaint   Patient presents with    Acne     History of Present Illness:  Shelley Peck is a 23 y.o. female with HPI as follows:  End of month 3 accutane. Good improvement chinline in last 2 weeks with less flares in cheek distribution.    Personal Skin Cancer History   Melanoma No   Nonmelanoma skin cancer No       Family Skin Cancer Hx     Problem Relation (Age of Onset)    Basal cell carcinoma Neg Hx    Melanoma Neg Hx    Squamous cell carcinoma Neg Hx        Patient's allergies, medications, past medical, surgical, family and social histories were reviewed and updated as appropriate.    Review of Systems    No constitutional symptoms, musculoskeletal symptoms.     Objective   Physical Examination:  Skin elements examined without findings:    Neck    Chest & Axillae    R Upper Extremity    L Upper Extremity  Skin elements examined with findings:    Head.  Skin exam findings:    Acneiform papules chinline      Data Reviewed:  Patient's relevant lab, radiology, micro, and pathology results were reviewed as appropriate.       Assessment   Assessment and Plan (numbered by problem):  1. Nodulocystic Acne: severe, refractory, necessitatingsystemictherapy with isotretinoin. Improvingoncurrenttherapy, end of month 3  -Reviewed isotretinoin in detail, including IPledge program. Risks/benefits of medication reviewed with patient, end of month   -Reviewedpotentialside effects of the medication including (but not limited to) xerosis, dry eyes and mouth, elevated liver enzymes, elevated triglyceride levels, alopecia, joint and muscle pain, changes in mood, headaches, etc.The patient was advised to contact us immediately if new or concerning symptoms or signs arise.  -Will need to continue with therapy until goal cumulative dose of 120-150mg /kg is obtained  -Target cumulative dose:120-150mg /kg(Started therapy 07/29/18)  -Recommend taking40mg  per day  -The patient was advised not to share  medication with other individuals  -The patient was advised not to donate blood products while on the medication  -The patient knows to inform our office should they start or stop any new medications    -iPLEDGE [id number]  -The patient was confirmed in the [facility] system today and the medication refilled.    2. Medication monitoring encounter for isotretinoin  -Reviewed need for periodic monitoring of blood work (CBC, AST, ALT, TG).  -Today plan to obtain following labs:urine pregnancy, cholesterol  -Reviewed that isotretinoin is a teratogen and that while on therapy there is a need to obtain monthly negative pregnancy tests. Last pregnancy test performed 1 mo ago. In addition the patient needs to be on 2 forms of birth control, which are documented to be:  1. IUD  2. OCP    Minor Procedure Notes (if applicable):     Counseling:  Assessment and Plan were discussed.  Counseled patient regarding pregnancy avoidance while on Rx       I performed this consultation using real-time Telehealth tools, including a live video connection between my location and the patient's location. Prior to initiating the consultation, I obtained informed verbal consent to perform this consultation using Telehealth tools and answered all the questions about the Telehealth interaction.

## 2018-11-07 LAB — HCG PREGNANCY, URINE, >= 18 YE: HCG Pregnancy, Urine, >= 18 ye: NEGATIVE

## 2018-11-11 NOTE — Telephone Encounter (Signed)
Updated pt Pharmacy

## 2018-11-11 NOTE — Telephone Encounter (Signed)
Pt would like [facility]  [place]. She said she it keeps getting sent to old pharmacy even though she has asked to update it before. Thank you.

## 2018-11-25 MED ORDER — ISOTRETINOIN 40 MG CAPSULE
40 mg | ORAL_CAPSULE | Freq: Every day | ORAL | 0 refills | Status: DC
Start: 2018-11-25 — End: 2019-02-05

## 2018-11-25 NOTE — Progress Notes (Signed)
Isotretinoin/acne

## 2018-11-26 ENCOUNTER — Ambulatory Visit: Admit: 2018-11-26 | Discharge: 2018-11-26 | Payer: BLUE CROSS/BLUE SHIELD

## 2018-11-26 DIAGNOSIS — L700 Acne vulgaris: Secondary | ICD-10-CM

## 2018-11-26 DIAGNOSIS — Z5181 Encounter for therapeutic drug level monitoring: Secondary | ICD-10-CM

## 2018-11-26 NOTE — Progress Notes (Signed)
Subjective   Chief Complaint   Patient presents with    Acne     flaring     History of Present Illness:  Shelley Peck is a 23 y.o. female with HPI as follows:  End of month 5 accutane 64'. Notes cracked lips but no mood changes or overly dry skin. Some acneiform papules supe cheeks but not frank acne.    Personal Skin Cancer History   Melanoma No   Nonmelanoma skin cancer No       Family Skin Cancer Hx     Problem Relation (Age of Onset)    Basal cell carcinoma Neg Hx    Melanoma Neg Hx    Squamous cell carcinoma Neg Hx        Patient's allergies, medications, past medical, surgical, family and social histories were reviewed and updated as appropriate.    Review of Systems    No constitutional symptoms, musculoskeletal symptoms.     Objective   Physical Examination:  Skin elements examined without findings:    Neck    Chest & Axillae    R Upper Extremity    L Upper Extremity  Skin elements examined with findings:    Head.  Skin exam findings:    Acneiform papules cheeks      Data Reviewed:  Patient's relevant lab, radiology, micro, and pathology results were reviewed as appropriate.       Assessment   Assessment and Plan (numbered by problem):  1. Nodulocystic Acne: severe, refractory, necessitatingsystemictherapy with isotretinoin. Improvingoncurrenttherapy, end of MONTH 5  -Reviewed isotretinoin in detail, including IPledge program. Risks/benefits of medication reviewed with patient, end of month   -Reviewedpotentialside effects of the medication including (but not limited to) xerosis, dry eyes and mouth, elevated liver enzymes, elevated triglyceride levels, alopecia, joint and muscle pain, changes in mood, headaches, etc.The patient was advised to contact us immediately if new or concerning symptoms or signs arise.  -Will need to continue with therapy until goal cumulative dose of 120-150mg /kg is obtained  -Target cumulative dose:120-150mg /kg(Started therapy 07/29/18)  -Recommend taking40mg  per  day  -The patient was advised not to share medication with other individuals  -The patient was advised not to donate blood products while on the medication  -The patient knows to inform our office should they start or stop any new medications    -iPLEDGE [id number]  -The patient was confirmed in the [facility] system today and the medication refilled.    2. Medication monitoring encounter for isotretinoin  -Reviewed need for periodic monitoring of blood work (CBC, AST, ALT, TG).  -Today plan to obtain following labs:urine pregnancy, cholesterol  -Reviewed that isotretinoin is a teratogen and that while on therapy there is a need to obtain monthly negative pregnancy tests. Last pregnancy test performed 1 mo ago. In addition the patient needs to be on 2 forms of birth control, which are documented to be:  1. IUD  2. OCP    Minor Procedure Notes (if applicable):     Counseling:  Assessment and Plan were discussed.  Differential Diagnosis and w/u plan discussed        I performed this consultation using real-time Telehealth tools, including a live video connection between my location and the patient's location. Prior to initiating the consultation, I obtained informed verbal consent to perform this consultation using Telehealth tools and answered all the questions about the Telehealth interaction.

## 2018-11-26 NOTE — Telephone Encounter (Signed)
Called pt and LVM with Dr. Roena Malady message

## 2018-11-26 NOTE — Telephone Encounter (Signed)
Please call pt and inform we put in preg test but do not see result.

## 2018-11-26 NOTE — Telephone Encounter (Signed)
[facility] Dr Lajuan Lines needs to confirm pt in [place].

## 2018-12-03 ENCOUNTER — Ambulatory Visit: Admit: 2018-12-03 | Discharge: 2018-12-03 | Payer: BLUE CROSS/BLUE SHIELD

## 2018-12-03 DIAGNOSIS — L700 Acne vulgaris: Secondary | ICD-10-CM

## 2018-12-03 LAB — HCG PREGNANCY, URINE, >= 18 YE: HCG Pregnancy, Urine, >= 18 ye: NEGATIVE

## 2019-01-06 ENCOUNTER — Ambulatory Visit: Admit: 2019-01-06 | Discharge: 2019-01-06 | Payer: BLUE CROSS/BLUE SHIELD

## 2019-01-06 DIAGNOSIS — Z5181 Encounter for therapeutic drug level monitoring: Secondary | ICD-10-CM

## 2019-01-06 DIAGNOSIS — L700 Acne vulgaris: Secondary | ICD-10-CM

## 2019-01-06 NOTE — Progress Notes (Signed)
Subjective   Chief Complaint   Patient presents with    Acne     chinline     History of Present Illness:  Shelley Peck is a 23 y.o. female with HPI as follows:  End of month 5 at accutane 9. In last month fewer breakouts at chinline, very dry lips. No joint tenderness or mood changes. Happy with progress.    Personal Skin Cancer History   Melanoma No   Nonmelanoma skin cancer No       Family Skin Cancer Hx     Problem Relation (Age of Onset)    Basal cell carcinoma Neg Hx    Melanoma Neg Hx    Squamous cell carcinoma Neg Hx        Patient's allergies, medications, past medical, surgical, family and social histories were reviewed and updated as appropriate.    Review of Systems    No constitutional symptoms, musculoskeletal symptoms.     Objective   Physical Examination:  Skin elements examined without findings:    Neck    Chest & Axillae    R Upper Extremity    L Upper Extremity  Skin elements examined with findings:    Head.  Skin exam findings:    Acneiform papules chinline      Data Reviewed:  Patient's relevant lab, radiology, micro, and pathology results were reviewed as appropriate.       Assessment   Assessment and Plan (numbered by problem):  1. Acne vulgaris cystic severe  - continue accutane to month 6 (7.5 months for 150 mg/kg)  - tbar reviewed in detail and all questions answered.  2. Med mon encounter  - severe teratogenicity of isotret reviewed in detail and all questions answered.  - no sharing pills or blood donation  - IUD > OCP  - preg test before next refill    Minor Procedure Notes (if applicable):     Counseling:  Assessment and Plan were discussed.  Differential Diagnosis and w/u plan discussed        I performed this consultation using real-time Telehealth tools, including a live video connection between my location and the patient's location. Prior to initiating the consultation, I obtained informed verbal consent to perform this consultation using Telehealth tools and answered all the  questions about the Telehealth interaction.

## 2019-01-08 DIAGNOSIS — L700 Acne vulgaris: Secondary | ICD-10-CM

## 2019-01-09 ENCOUNTER — Ambulatory Visit: Admit: 2019-01-09 | Discharge: 2019-01-09 | Payer: BLUE CROSS/BLUE SHIELD

## 2019-01-09 LAB — HCG PREGNANCY, URINE, >= 18 YE: HCG Pregnancy, Urine, >= 18 ye: NEGATIVE

## 2019-01-11 MED ORDER — ISOTRETINOIN 40 MG CAPSULE
40 mg | ORAL_CAPSULE | Freq: Every day | ORAL | 0 refills | Status: AC
Start: 2019-01-11 — End: 2019-02-05

## 2019-01-11 NOTE — Telephone Encounter (Signed)
Patient called and is out of town and needs medication [facility] [address] [place] [address]. Please contact patient when completed. Thanks

## 2019-01-11 NOTE — Telephone Encounter (Signed)
Requested Prescriptions     Pending Prescriptions Disp Refills    ISOtretinoin (ACCUTANE) 40 mg capsule 30 capsule 0     Sig: Take 1 capsule (40 mg total) by mouth daily       Pharmacy updated

## 2019-02-05 ENCOUNTER — Ambulatory Visit: Admit: 2019-02-05 | Discharge: 2019-02-05 | Payer: BLUE CROSS/BLUE SHIELD

## 2019-02-05 DIAGNOSIS — L700 Acne vulgaris: Secondary | ICD-10-CM

## 2019-02-05 DIAGNOSIS — Z5181 Encounter for therapeutic drug level monitoring: Secondary | ICD-10-CM

## 2019-02-05 MED ORDER — ISOTRETINOIN 40 MG CAPSULE
40 | ORAL_CAPSULE | Freq: Every day | ORAL | 0 refills | Status: AC
Start: 2019-02-05 — End: ?

## 2019-02-05 MED ORDER — ISOTRETINOIN 40 MG CAPSULE
40 | ORAL_CAPSULE | Freq: Every day | ORAL | 0 refills | Status: DC
Start: 2019-02-05 — End: 2019-02-05

## 2019-02-05 NOTE — Progress Notes (Signed)
Subjective   Chief Complaint   Patient presents with    Acne     isotret f/u     History of Present Illness:  Shelley Peck is a 23 y.o. female with HPI as follows:  Now at 7/7.5 months to hit 150 mg.kg. IUD > OCP. Today reports    Personal Skin Cancer History   Melanoma No   Nonmelanoma skin cancer No       Family Skin Cancer Hx     Problem Relation (Age of Onset)    Basal cell carcinoma Neg Hx    Melanoma Neg Hx    Squamous cell carcinoma Neg Hx        Patient's allergies, medications, past medical, surgical, family and social histories were reviewed and updated as appropriate.    Review of Systems    No joint or other systemic symptoms noted.     Objective   Physical Examination:  Skin elements examined without findings:    Neck    Chest & Axillae    R Upper Extremity    L Upper Extremity  Skin elements examined with findings:    Head.  Skin exam findings:    Acneiform papules BL cheeks      Data Reviewed:          Assessment   Assessment and Plan (numbered by problem):    Nodulocystic Acne - patient interested in continuing accutane, having finished month 7 (out of 7.5 target total).  - Acne is severe; refractory, flaring currently not treated, with evidence of scarring; necessitating systemic therapy with isotretinoin.    -We reviewed the pathogenesis of acne in detail including multifactorial contributing components including components of follicular occlusion, hormonal influences, and inflammatory processes.   -Treatment options discussed with the patient and family.  -Reviewed isotretinoin in detail.  Isotretinoin is a vitamin A derivative that is taken orally and can be very effective in treatment of acne.    -Reviewed potential side effects of the medication including (but not limited to) xerosis, dry eyes and mouth, elevated liver enzymes, elevated triglyceride levels, alopecia, joint and muscle pain, changes in mood, headaches, etc.  -Reviewed need for periodic monitoring of blood work (CBC, AST, ALT,  TG)  -Will need to pursue therapy until goal cumulative dose of 120-150mg /kg is obtained.    -The patient was advised not to share medication with other individuals  -The patient was advised not to donate blood products while on the medication  -The patient knows to inform our office should they start or stop any new medications  -For females: discussed that isotretinoin is a teratogen and cause severe birth defects should the patient become pregnant while on the medication.  Reviewed that in order to start isotretinoin 2 forms of birth control must be documented and used throughout therapy.  -2 forms of birth control:       1. IUD        2. OCP  -Prior to starting the medication the patient needs 2 negative pregnancy tests spaced 30 days apart.  In addition while on isotretinoin therapy there is a continued need for documented monthly negative pregnancy tests.  - Ipledge program instructions reviewed in detail and all questions answered.         2. Medication monitoring encounter for isotretinoin  -Reviewed need for periodic monitoring of blood work (CBC, AST, ALT, TG).  -Today plan to obtain following labs: pregnancy     Minor Procedure Notes (if applicable):  Counseling:  Assessment and Plan were discussed.  5: High-risk Rx prescribing or dose-adjustment, including counseling on major r/b/se and plan for intensive toxicity monitoring, performed today (High Risk Rx Mgmt)

## 2019-02-10 LAB — HCG PREGNANCY, SERUM, QUANTITA: HCG Pregnancy, Serum, >= 18 ye: <1 m[IU]/mL

## 2019-03-11 ENCOUNTER — Ambulatory Visit: Admit: 2019-03-11 | Discharge: 2019-03-11 | Payer: BLUE CROSS/BLUE SHIELD

## 2019-03-11 DIAGNOSIS — Z5181 Encounter for therapeutic drug level monitoring: Secondary | ICD-10-CM

## 2019-03-11 DIAGNOSIS — L700 Acne vulgaris: Secondary | ICD-10-CM

## 2019-03-11 NOTE — Progress Notes (Signed)
Subjective   Chief Complaint   Patient presents with    Acne     less scarring     History of Present Illness:  Shelley Peck is a 24 y.o. female with HPI as follows:  Should have completed 7.5 months of accutane to complete full 150 mg/kg course. Reports    Personal Skin Cancer History   Melanoma No   Nonmelanoma skin cancer No       Family Skin Cancer Hx     Problem Relation (Age of Onset)    Basal cell carcinoma Neg Hx    Melanoma Neg Hx    Squamous cell carcinoma Neg Hx        Patient's allergies, medications, past medical, surgical, family and social histories were reviewed and updated as appropriate.    Review of Systems       Objective   Physical Examination:  Skin elements examined without findings:    Neck    Chest & Axillae    R Upper Extremity    L Upper Extremity  Skin elements examined with findings:    Head.  Skin exam findings:    Dry scaly papules BL cheeks very sparse acne      Data Reviewed:          Assessment   Assessment and Plan (numbered by problem):    1. Nodulocystic Acne still uncontrolled, patient interested in continuing accutane, having finished month 7.5.  -Weight based dosage for 150 mg/kg is 7.5 months.  - Acne is severe; refractory, flaring currently not treated, with evidence of scarring; necessitating systemic therapy with isotretinoin.    -We reviewed the pathogenesis of acne in detail including multifactorial contributing components including components of follicular occlusion, hormonal influences, and inflammatory processes.   -Treatment options discussed with the patient and family.  -Reviewed isotretinoin in detail.  Isotretinoin is a vitamin A derivative that is taken orally and can be very effective in treatment of acne.    -Reviewed potential side effects of the medication including (but not limited to) xerosis, dry eyes and mouth, elevated liver enzymes, elevated triglyceride levels, alopecia, joint and muscle pain, changes in mood, headaches, etc.  -Reviewed need for  periodic monitoring of blood work (CBC, AST, ALT, TG)  -Will need to pursue therapy until goal cumulative dose of 120-150mg /kg is obtained.    -The patient was advised not to share medication with other individuals  -The patient was advised not to donate blood products while on the medication  -The patient knows to inform our office should they start or stop any new medications  -For females: discussed that isotretinoin is a teratogen and cause severe birth defects should the patient become pregnant while on the medication.  Reviewed that in order to start isotretinoin 2 forms of birth control must be documented and used throughout therapy.  -Birth control method: IUD > OCP    - Ipledge program instructions reviewed in detail and all questions answered.     Discuss no pregnancy for next month and d/c isotretinoin.        2. Medication monitoring encounter for isotretinoin  -Reviewed need for periodic monitoring of blood work (CBC, AST, ALT, TG).  -Today plan to obtain following labs: pregnancy     Minor Procedure Notes (if applicable):     Counseling:  Assessment and Plan were discussed.  4: Mod-risk Rx side-effect or toxicity monitoring performed today, with appropriate counseling (Moderate Risk Rx Mgmt)      I performed  this consultation using real-time Telehealth tools, including a live video connection between my location and the patient's location. Prior to initiating the consultation, I obtained informed verbal consent to perform this consultation using Telehealth tools and answered all the questions about the Telehealth interaction.

## 2019-03-18 DIAGNOSIS — L700 Acne vulgaris: Secondary | ICD-10-CM

## 2019-03-19 ENCOUNTER — Ambulatory Visit: Admit: 2019-03-19 | Discharge: 2019-03-19 | Payer: BLUE CROSS/BLUE SHIELD

## 2019-03-19 LAB — HCG PREGNANCY, SERUM, QUANTITA: HCG Pregnancy, Serum, >= 18 ye: <1 IU/L (ref <5)

## 2020-05-08 ENCOUNTER — Inpatient Hospital Stay: Admit: 2020-05-08 | Payer: BLUE CROSS/BLUE SHIELD | Primary: Physician

## 2020-05-08 DIAGNOSIS — R102 Pelvic and perineal pain: Secondary | ICD-10-CM

## 2021-09-21 ENCOUNTER — Ambulatory Visit: Admit: 2021-09-21 | Discharge: 2021-09-26 | Payer: BLUE CROSS/BLUE SHIELD | Attending: Physician | Primary: Physician

## 2021-09-21 DIAGNOSIS — L700 Acne vulgaris: Secondary | ICD-10-CM

## 2021-09-21 MED ORDER — TRETINOIN 0.025 % TOPICAL CREAM
0.025 | Freq: Every day | TOPICAL | 5 refills | Status: AC
Start: 2021-09-21 — End: ?

## 2021-09-21 MED ORDER — SKYLA 14 MCG/24 HR (UP TO 3 YEARS) 13.5 MG INTRAUTERINE DEVICE
14 | Freq: Once | INTRAUTERINE | 0.00 refills | Status: AC
Start: 2021-09-21 — End: ?

## 2021-09-21 MED ORDER — SPIRONOLACTONE 50 MG TABLET
50 | ORAL_TABLET | Freq: Every day | ORAL | 2 refills | 30.00000 days | Status: DC
Start: 2021-09-21 — End: 2021-09-21

## 2021-09-21 MED ORDER — SPIRONOLACTONE 50 MG TABLET
50 | ORAL_TABLET | ORAL | 3 refills | 30.00000 days | Status: AC
Start: 2021-09-21 — End: ?

## 2021-09-21 NOTE — Progress Notes (Signed)
Subjective   Chief Complaint   Patient presents with    Acne     Follow up- face     History of Present Illness:  Shelley Peck is a 26 y.o. female with HPI as follows:    Last visit with me on 03/11/19 at expected completion of 7.5 months of Accutane and anticipation to complete the full 150 mg/kg course for the following month. No pregnancy test was needed for the following month as isotretinoin was being discontinued. Here today for follow up.    She reports flaring acne on the forehead and chin. Current acne has been going on for 6 months. Has bumps on the bottom of chin and forehead. The bumps on the forehead appear as whiteheads. When she was on isotretinoin, she experienced dryness of the hair and chapped lips, which were not bothersome. Acne intensity today is rated at a 7/10, approximately 70% as severe as previous acne. She had headaches when she was on spironolactone.    Also reports skin debris on her nose that have been present for 6 months. The debris is blue in color. The symptoms used to come and go. The spots are not bad today. Used to wear adhesive strips.    Personal Skin Cancer History   Melanoma No   Nonmelanoma skin cancer No       Family Skin Cancer Hx       Problem Relation (Age of Onset)    Basal cell carcinoma Neg Hx    Melanoma Neg Hx    Squamous cell carcinoma Neg Hx          Patient's allergies, medications, past medical, surgical, family and social histories were reviewed and updated as appropriate.    Review of Systems   Skin/Breast:  Negative for skin pruritus.        Objective   Physical Examination:  Skin elements examined with findings:    Head.  Skin exam findings:      - Comedonal acne on the forehead and deeper cystic papules on the bilateral chin    Additional Exam Findings    General Appearance negative: Well-appearing, well-nourished, with no obvious deformities.     Orientation negative: Oriented to time, place and person.     Mood & Affect negative: No evidence of depression,  mania, anxiety, or agitation.     Data Reviewed:        Assessment   Assessment and Plan (numbered by problem):    1. Acne vulgaris, chronic, poorly controlled   - Treatment, benefits, and risks of oral spironolactone and topical tretinoin reviewed at length.   - restart spironolactone 50 mg, will stop early if experiencing any headaches or other side effects  - start tretinoin cream, up-taper, to be applied to the forehead    2. Medication monitoring encounter for isotretinoin  -Reviewed need for periodic monitoring of blood work (CBC, AST, ALT, TG).    Follow up in 7 weeks via video.    Minor Procedure Notes (if applicable):     Counseling:  Assessment and Plan were discussed.  4: Rx prescribing, dose-adjustment, and/or counseling on r/b/se, performed today (Moderate Risk Rx Mgmt)  4: Rx side-effect or toxicity monitoring performed today, with appropriate counseling (Moderate Risk Rx Mgmt)    I, Clyde Villacisneros, am acting as a Neurosurgeon for services provided by Syble Creek, MD on 09/21/21 3:19 PM.    The above scribed documentation as annotated by me accurately reflects the services I have  provided.   Syble Creek, MD  09/21/2021 3:33 PM
